# Patient Record
Sex: Female | Born: 1970 | Race: Asian | Hispanic: No | Marital: Single | Smoking: Never smoker
Health system: Southern US, Community
[De-identification: ages and names within clinical notes are randomized; demographics above are authoritative.]

## PROBLEM LIST (undated history)

## (undated) DIAGNOSIS — Z803 Family history of malignant neoplasm of breast: Secondary | ICD-10-CM

## (undated) DIAGNOSIS — Z8041 Family history of malignant neoplasm of ovary: Secondary | ICD-10-CM

## (undated) HISTORY — DX: Family history of malignant neoplasm of breast: Z80.3

## (undated) HISTORY — DX: Family history of malignant neoplasm of ovary: Z80.41

---

## 2012-12-04 HISTORY — PX: BREAST BIOPSY: SHX20

## 2012-12-12 ENCOUNTER — Other Ambulatory Visit: Payer: Self-pay | Admitting: Gynecology

## 2012-12-12 DIAGNOSIS — R928 Other abnormal and inconclusive findings on diagnostic imaging of breast: Secondary | ICD-10-CM

## 2012-12-27 ENCOUNTER — Ambulatory Visit
Admission: RE | Admit: 2012-12-27 | Discharge: 2012-12-27 | Disposition: A | Discharge status: Home / Self Care | Payer: No Typology Code available for payment source | Source: Ambulatory Visit | Attending: Gynecology | Admitting: Gynecology

## 2012-12-27 DIAGNOSIS — R928 Other abnormal and inconclusive findings on diagnostic imaging of breast: Secondary | ICD-10-CM

## 2013-05-30 ENCOUNTER — Other Ambulatory Visit: Payer: Self-pay | Admitting: Gynecology

## 2013-05-30 DIAGNOSIS — R921 Mammographic calcification found on diagnostic imaging of breast: Secondary | ICD-10-CM

## 2013-07-01 ENCOUNTER — Other Ambulatory Visit: Payer: Self-pay | Admitting: Gynecology

## 2013-07-01 ENCOUNTER — Ambulatory Visit
Admission: RE | Admit: 2013-07-01 | Discharge: 2013-07-01 | Disposition: A | Discharge status: Home / Self Care | Payer: No Typology Code available for payment source | Source: Ambulatory Visit | Attending: Gynecology | Admitting: Gynecology

## 2013-07-01 DIAGNOSIS — R921 Mammographic calcification found on diagnostic imaging of breast: Secondary | ICD-10-CM

## 2013-07-23 ENCOUNTER — Ambulatory Visit
Admission: RE | Admit: 2013-07-23 | Discharge: 2013-07-23 | Disposition: A | Discharge status: Home / Self Care | Payer: No Typology Code available for payment source | Source: Ambulatory Visit | Attending: Gynecology | Admitting: Gynecology

## 2013-07-23 DIAGNOSIS — R921 Mammographic calcification found on diagnostic imaging of breast: Secondary | ICD-10-CM

## 2014-08-25 ENCOUNTER — Other Ambulatory Visit: Payer: Self-pay

## 2014-11-05 ENCOUNTER — Other Ambulatory Visit: Payer: Self-pay | Admitting: Gynecology

## 2014-11-06 LAB — CYTOLOGY - PAP

## 2016-01-12 ENCOUNTER — Telehealth: Payer: Self-pay | Admitting: Genetic Counselor

## 2016-01-12 NOTE — Telephone Encounter (Signed)
Contacted pt and reviewed avaiable dates for the end of Feb and start of March for her genetic counseling referral

## 2016-01-31 ENCOUNTER — Ambulatory Visit (HOSPITAL_BASED_OUTPATIENT_CLINIC_OR_DEPARTMENT_OTHER): Payer: Managed Care, Other (non HMO) | Admitting: Genetic Counselor

## 2016-01-31 ENCOUNTER — Encounter: Payer: Self-pay | Admitting: Genetic Counselor

## 2016-01-31 ENCOUNTER — Other Ambulatory Visit: Payer: Managed Care, Other (non HMO)

## 2016-01-31 DIAGNOSIS — Z8041 Family history of malignant neoplasm of ovary: Secondary | ICD-10-CM

## 2016-01-31 DIAGNOSIS — Z315 Encounter for genetic counseling: Secondary | ICD-10-CM | POA: Diagnosis not present

## 2016-01-31 DIAGNOSIS — Z803 Family history of malignant neoplasm of breast: Secondary | ICD-10-CM | POA: Insufficient documentation

## 2016-01-31 NOTE — Progress Notes (Signed)
REFERRING PROVIDER: David Lowe, MD [address], [address] [place], [place] [address]  PRIMARY PROVIDER:  No primary care provider on file.  PRIMARY REASON FOR VISIT:  1. Family history of ovarian cancer   2. Family history of breast cancer      HISTORY OF PRESENT ILLNESS:   Deborah Lynch, a 45 y.o. female, was seen for a [place] cancer genetics consultation at the request of Dr. Lowe due to a family history of cancer.  Deborah Lynch presents to clinic today to discuss the possibility of a hereditary predisposition to cancer, genetic testing, and to further clarify her future cancer risks, as well as potential cancer risks for family members. Deborah Lynch is a 45 y.o. female with no personal history of cancer.  She is concerned about her risk and her children's risk for developing cancer.  CANCER HISTORY:   No history exists.     HORMONAL RISK FACTORS:  Menarche was at age 12.  First live birth at age 33.  OCP use for approximately 0 years.  Ovaries intact: yes.  Hysterectomy: no.  Menopausal status: perimenopausal.  HRT use: 0 years. Colonoscopy: no; not examined. Mammogram within the last year: yes. Number of breast biopsies: 1. Up to date with pelvic exams:  yes. Any excessive radiation exposure in the past:  no  Past Medical History  Diagnosis Date  . Family history of breast cancer   . Family history of ovarian cancer     History reviewed. No pertinent past surgical history.  Social History   Social History  . Marital Status: Single    Spouse Name: N/A  . Number of Children: 2  . Years of Education: N/A   Social History Main Topics  . Smoking status: Never Smoker   . Smokeless tobacco: None  . Alcohol Use: No  . Drug Use: No  . Sexual Activity: Not Asked   Other Topics Concern  . None   Social History Narrative  . None     FAMILY HISTORY:  We obtained a detailed, 4-generation family history.  Significant diagnoses are listed below: Family History   Problem Relation Age of Onset  . Breast cancer Mother 52  . Ovarian cancer Mother 75  . CAD Father   . Heart attack Paternal Uncle 38  . Breast cancer Maternal Grandmother 32    2nd breast cancer at 42  . Heart attack Paternal Grandfather   . Breast cancer Maternal Aunt 53    The patient has two children who are cancer free.  Her brother is alive and well at age 44.  The patient's mother was diagnosed with breast cancer at age 52 and ovarian cancer at age 75.  Her mother had two sisters, one who had breast cancer at 53.  The patient's maternal grandmother had breast cancer at 42 and again at 52.  Her grandfather died at age 80.  The pateint's father is alive at 80.  HE has CAD.  He had one brother who died of a heart attack at age 38.  His mother died at 92 and his father died at 49 from a heart attack.  Patient's maternal ancestors are of [place] descent, and paternal ancestors are of [place] descent. There is no reported Ashkenazi Jewish ancestry. There is no known consanguinity.  GENETIC COUNSELING ASSESSMENT: Deborah Lynch is a 45 y.o. female with a family history of breast and ovarian cancer which is somewhat suggestive of a hereditary cancer syndrome and predisposition   to cancer. We, therefore, discussed and recommended the following at today's visit.   DISCUSSION: We discussed taht about 5-10% of breast cancer is hereditary with BRCA mutations being the most common cause of hereditary breast cancer.  We can also see mutations in other genes including PALB2, CHEK2 and ATM.  We reviewed the characteristics, features and inheritance patterns of hereditary cancer syndromes. We also discussed genetic testing, including the appropriate family members to test, the process of testing, insurance coverage and turn-around-time for results. We discussed the implications of a negative, positive and/or variant of uncertain significant result. We recommended Deborah Lynch pursue genetic testing  for the Breast/Ovarian cancer gene panel. The Breast/Ovarian gene panel offered by [facility] includes sequencing and rearrangement analysis for the following 20 genes:  ATM, BARD1, BRCA1, BRCA2, BRIP1, CDH1, CHEK2, EPCAM, FANCC, MLH1, MSH2, MSH6, NBN, PALB2, PMS2, PTEN, RAD51C, RAD51D, TP53, and XRCC2.     Based on Deborah Lynch's family history of cancer, she meets medical criteria for genetic testing. Despite that she meets criteria, she may still have an out of pocket cost. We discussed that if her out of pocket cost for testing is over $100, the laboratory will call and confirm whether she wants to proceed with testing.  If the out of pocket cost of testing is less than $100 she will be billed by the genetic testing laboratory.   In order to estimate her chance of having a BRCA mutation, we used statistical models (Penn II, Myriad calculator and [employer]) and laboratory data that take into account her personal medical history, family history and ancestry.  Because each model is different, there can be a lot of variability in the risks they give.  Therefore, these numbers must be considered a rough range and not a precise risk of having a BRCA mutation.  These models estimate that she has approximately a 7.2-17.72% chance of having a mutation. Based on this assessment of her family and personal history, genetic testing is recommended.  Based on the patient's personal and family history, statistical models (Tyrer Cusik)  and literature data were used to estimate her risk of developing breast cancer. These estimate her lifetime risk of developing breast cancer to be approximately 47.7%. This estimation does not take into account any genetic testing results.  The patient's lifetime breast cancer risk is a preliminary estimate based on available information using one of several models endorsed by the [place] (ACS). The ACS recommends consideration of breast MRI screening as an adjunct to  mammography for patients at high risk (defined as 20% or greater lifetime risk). A more detailed breast cancer risk assessment can be considered, if clinically indicated.   Deborah Lynch has been determined to be at high risk for breast cancer.  Therefore, we recommend that annual screening with mammography and breast MRI begin at age 60, or 10 years prior to the age of breast cancer diagnosis in a relative (whichever is earlier).  We discussed that Deborah Lynch should discuss her individual situation with her referring physician and determine a breast cancer screening plan with which they are both comfortable.    PLAN: After considering the risks, benefits, and limitations, Deborah Lynch  provided informed consent to pursue genetic testing and the blood sample was sent to [facility] for analysis of the Breast/Ovarian cancer panel. Results should be available within approximately 2-3 weeks' time, at which point they will be disclosed by telephone to Deborah Lynch, as will any additional recommendations warranted by these  results. Deborah Lynch will receive a summary of her genetic counseling visit and a copy of her results once available. This information will also be available in Epic. We encouraged Deborah Lynch to remain in contact with cancer genetics annually so that we can continuously update the family history and inform her of any changes in cancer genetics and testing that may be of benefit for her family. Deborah Lynch questions were answered to her satisfaction today. Our contact information was provided should additional questions or concerns arise.  Based on Deborah Lynch's family history, we recommended her mother, who was diagnosed with ovarian cancer at age 39 and breast cancer at age 71, have genetic counseling and testing. Deborah Lynch will let us know if we can be of any assistance in coordinating genetic counseling and/or testing for this family member.   Lastly, we encouraged Deborah Lynch to remain in  contact with cancer genetics annually so that we can continuously update the family history and inform her of any changes in cancer genetics and testing that may be of benefit for this family.   Ms.  Lynch questions were answered to her satisfaction today. Our contact information was provided should additional questions or concerns arise. Thank you for the referral and allowing [place] to share in the care of your patient.   Karlyn Glasco P. Florene Glen, [place], [facility] Certified Genetic Counselor [email or web address] phone: [phone]  The patient was seen for a total of 60 minutes in face-to-face genetic counseling.  This patient was discussed with Drs. Magrinat, Lindi Adie and/or Burr Medico who agrees with the above.    _______________________________________________________________________ For Office Staff:  Number of people involved in session: 1 Was an Intern/ student involved with case: yes

## 2016-02-02 ENCOUNTER — Other Ambulatory Visit: Payer: Self-pay | Admitting: Obstetrics and Gynecology

## 2016-02-02 DIAGNOSIS — R922 Inconclusive mammogram: Secondary | ICD-10-CM

## 2016-02-17 ENCOUNTER — Telehealth: Payer: Self-pay | Admitting: Genetic Counselor

## 2016-02-17 ENCOUNTER — Encounter: Payer: Self-pay | Admitting: Genetic Counselor

## 2016-02-17 DIAGNOSIS — Z1379 Encounter for other screening for genetic and chromosomal anomalies: Secondary | ICD-10-CM | POA: Insufficient documentation

## 2016-02-17 NOTE — Telephone Encounter (Signed)
Revealed negative genetic testing on the B/O panel.  Discussed that the most informative person to test is her mother.  She would like to get her mother tested,  And she can come to [facility] rather than [facility] or [facility] [employer].  Her mother is not in our system so she will need to be entered in before we schedule.  Explained that there are testing options for her mother since her mother is not a [place] citizen. We can use Myriad financial assistance or we could do [facility] with an OOP of about $250.  I will email the Myriad application to her so she can view it and she will get her mother scheduled.

## 2016-02-18 ENCOUNTER — Ambulatory Visit: Payer: Self-pay | Admitting: Genetic Counselor

## 2016-02-18 DIAGNOSIS — Z1379 Encounter for other screening for genetic and chromosomal anomalies: Secondary | ICD-10-CM

## 2016-02-18 DIAGNOSIS — Z8041 Family history of malignant neoplasm of ovary: Secondary | ICD-10-CM

## 2016-02-18 DIAGNOSIS — Z803 Family history of malignant neoplasm of breast: Secondary | ICD-10-CM

## 2016-02-18 NOTE — Progress Notes (Signed)
HPI: Ms. Pointer was previously seen in the [place] clinic due to a family history of cancer and concerns regarding a hereditary predisposition to cancer. Please refer to our prior cancer genetics clinic note for more information regarding Ms. Kalis's medical, social and family histories, and our assessment and recommendations, at the time. Ms. Kenedy recent genetic test results were disclosed to her, as were recommendations warranted by these results. These results and recommendations are discussed in more detail below.  FAMILY HISTORY:  We obtained a detailed, 4-generation family history.  Significant diagnoses are listed below: Family History  Problem Relation Age of Onset  . Breast cancer Mother 82  . Ovarian cancer Mother 50  . CAD Father   . Heart attack Paternal Uncle 48  . Breast cancer Maternal Grandmother 32    2nd breast cancer at 71  . Heart attack Paternal Grandfather   . Breast cancer Maternal Aunt 53    The patient has two children who are cancer free. Her brother is alive and well at age 19. The patient's mother was diagnosed with breast cancer at age 62 and ovarian cancer at age 17. Her mother had two sisters, one who had breast cancer at 53. The patient's maternal grandmother had breast cancer at 73 and again at 85. Her grandfather died at age 7. The pateint's father is alive at 72. HE has CAD. He had one brother who died of a heart attack at age 103. His mother died at 63 and his father died at 25 from a heart attack. Patient's maternal ancestors are of [place] descent, and paternal ancestors are of [place] descent. There is no reported Ashkenazi Jewish ancestry. There is no known consanguinity.  GENETIC TEST RESULTS: At the time of Ms. Borges's visit, we recommended she pursue genetic testing of the Breast/Ovarian cancer gene panel. The Breast/Ovarian gene panel offered by [facility] includes sequencing and rearrangement analysis for  the following 20 genes:  ATM, BARD1, BRCA1, BRCA2, BRIP1, CDH1, CHEK2, EPCAM, FANCC, MLH1, MSH2, MSH6, NBN, PALB2, PMS2, PTEN, RAD51C, RAD51D, TP53, and XRCC2.   The report date is February 16, 2016.  Genetic testing was normal, and did not reveal a deleterious mutation in these genes. The test report has been scanned into EPIC and is located under the Molecular Pathology section of the Results Review tab.   We discussed with Ms. Dresser that since the current genetic testing is not perfect, it is possible there may be a gene mutation in one of these genes that current testing cannot detect, but that chance is small. We also discussed, that it is possible that another gene that has not yet been discovered, or that we have not yet tested, is responsible for the cancer diagnoses in the family, and it is, therefore, important to remain in touch with cancer genetics in the future so that we can continue to offer Ms. Rondon the most up to date genetic testing.   CANCER SCREENING RECOMMENDATIONS: Given Ms. Rish's personal and family histories, we must interpret these negative results with some caution.  Families with features suggestive of hereditary risk for cancer tend to have multiple family members with cancer, diagnoses in multiple generations and diagnoses before the age of 110. Ms. Biber family exhibits some of these features. Thus this result may simply reflect our current inability to detect all mutations within these genes or there may be a different gene that has not yet been discovered or tested.   RECOMMENDATIONS FOR  FAMILY MEMBERS: Women in this family might be at some increased risk of developing cancer, over the general population risk, simply due to the family history of cancer. We recommended women in this family have a yearly mammogram beginning at age 23, or 108 years younger than the earliest onset of cancer, an an annual clinical breast exam, and perform monthly breast self-exams. Women in  this family should also have a gynecological exam as recommended by their primary provider. All family members should have a colonoscopy by age 59.  Based on Ms. Saggese's family history, we recommended her mother, who was diagnosed with breast cancer at age 78 and ovarian cancer at age 21, have genetic counseling and testing. Ms. Mcgonagle will let us know if we can be of any assistance in coordinating genetic counseling and/or testing for this family member. She will call our appointment line and schedule her mother in the genetics clinic for genetic testing.  FOLLOW-UP: Lastly, we discussed with Ms. Southers that cancer genetics is a rapidly advancing field and it is possible that new genetic tests will be appropriate for her and/or her family members in the future. We encouraged her to remain in contact with cancer genetics on an annual basis so we can update her personal and family histories and let her know of advances in cancer genetics that may benefit this family.   Our contact number was provided. Ms. Bacha questions were answered to her satisfaction, and she knows she is welcome to call us at anytime with additional questions or concerns.   Roma Kayser, MS, [facility] Certified Genetic Counselor [email or web address]

## 2017-12-04 HISTORY — PX: BREAST BIOPSY: SHX20

## 2018-07-11 ENCOUNTER — Other Ambulatory Visit: Payer: Self-pay | Admitting: Obstetrics and Gynecology

## 2018-07-11 DIAGNOSIS — Z803 Family history of malignant neoplasm of breast: Secondary | ICD-10-CM

## 2018-07-22 ENCOUNTER — Ambulatory Visit
Admission: RE | Admit: 2018-07-22 | Discharge: 2018-07-22 | Disposition: A | Discharge status: Home / Self Care | Payer: Managed Care, Other (non HMO) | Source: Ambulatory Visit | Attending: Obstetrics and Gynecology | Admitting: Obstetrics and Gynecology

## 2018-07-22 DIAGNOSIS — Z803 Family history of malignant neoplasm of breast: Secondary | ICD-10-CM

## 2018-07-22 MED ORDER — GADOBENATE DIMEGLUMINE 529 MG/ML IV SOLN
11.0000 mL | Freq: Once | INTRAVENOUS | Status: AC | PRN
  Administered 2018-07-22: 11 mL via INTRAVENOUS

## 2018-07-24 ENCOUNTER — Other Ambulatory Visit: Payer: Self-pay | Admitting: Obstetrics and Gynecology

## 2018-07-24 DIAGNOSIS — R9389 Abnormal findings on diagnostic imaging of other specified body structures: Secondary | ICD-10-CM

## 2018-07-26 ENCOUNTER — Other Ambulatory Visit: Payer: Self-pay | Admitting: Obstetrics and Gynecology

## 2018-07-26 DIAGNOSIS — R9389 Abnormal findings on diagnostic imaging of other specified body structures: Secondary | ICD-10-CM

## 2018-08-02 ENCOUNTER — Ambulatory Visit
Admission: RE | Admit: 2018-08-02 | Discharge: 2018-08-02 | Disposition: A | Discharge status: Home / Self Care | Payer: Managed Care, Other (non HMO) | Source: Ambulatory Visit | Attending: Obstetrics and Gynecology | Admitting: Obstetrics and Gynecology

## 2018-08-02 ENCOUNTER — Other Ambulatory Visit: Payer: Self-pay | Admitting: Obstetrics and Gynecology

## 2018-08-02 DIAGNOSIS — R9389 Abnormal findings on diagnostic imaging of other specified body structures: Secondary | ICD-10-CM

## 2018-08-02 MED ORDER — GADOBENATE DIMEGLUMINE 529 MG/ML IV SOLN
11.0000 mL | Freq: Once | INTRAVENOUS | Status: AC | PRN
  Administered 2018-08-02: 11 mL via INTRAVENOUS

## 2018-08-09 ENCOUNTER — Other Ambulatory Visit: Payer: Managed Care, Other (non HMO)

## 2018-08-12 ENCOUNTER — Ambulatory Visit
Admission: RE | Admit: 2018-08-12 | Discharge: 2018-08-12 | Disposition: A | Discharge status: Home / Self Care | Payer: Managed Care, Other (non HMO) | Source: Ambulatory Visit | Attending: Obstetrics and Gynecology | Admitting: Obstetrics and Gynecology

## 2018-08-12 DIAGNOSIS — R9389 Abnormal findings on diagnostic imaging of other specified body structures: Secondary | ICD-10-CM

## 2018-08-12 MED ORDER — GADOBENATE DIMEGLUMINE 529 MG/ML IV SOLN: 10.0000 mL | Freq: Once | INTRAVENOUS | Status: DC | PRN

## 2019-01-06 IMAGING — MR MR BREAST BX W LOC DEV 1ST LESION IMAGE BX SPEC MR GUIDE*R*
9 of 12 series · 34 of 48 positions shown · IV contrast (11ml Multihance)
Comparison: Previous exams.

ADDENDUM:
Pathology revealed FIBROCYSTIC CHANGES WITH ADENOSIS of RIGHT
breast, central. This was found to be concordant by Dr. Seby
Quram.

Pathology revealed FIBROADENOMA of LEFT breast, 1 o'clock. This was
found to be concordant by Dr. Adalbjorn Patagoc.
Pathology results were discussed with the patient by telephone. The
patient reported doing well after the biopsy with tenderness at the
site. Post biopsy instructions and care were reviewed and questions
were answered. The patient was encouraged to call The [REDACTED]
It is recommended the patient have two additional LEFT breast MRI
guided core needle biopsies which have been scheduled for [place]
Further recommendations will be dictated post LEFT breast MRI
biopsies.
Pathology results reported by Dombi Bilics, RN on 08/06/2018.
CLINICAL DATA: Right central breast area of linear non mass
enhancement seen on most recent screening MRI.
EXAM:
MRI GUIDED CORE NEEDLE BIOPSY OF THE RIGHT BREAST
TECHNIQUE: Multiplanar, multisequence MR imaging of the right breast was
performed both before and after administration of intravenous
contrast.
CONTRAST:  11mL MULTIHANCE GADOBENATE DIMEGLUMINE 529 MG/ML IV SOLN

[Series 3: fiducial unilateral · sagittal · 2.0mm · 1.33mm/px · 3 of 56 slices shown]
[im 1/56]
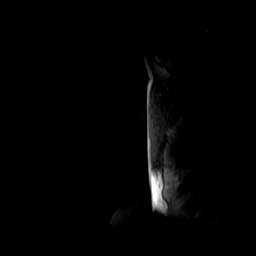
[im 28/56]
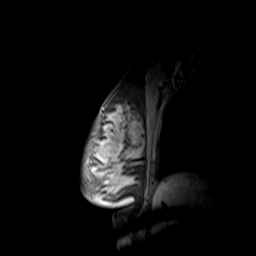
[im 56/56]
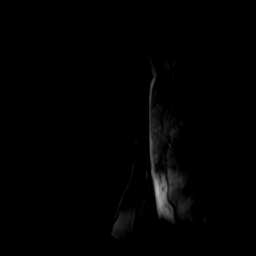

[Series 4: dynamic pre · axial · non-contrast · 1.3mm · 0.73mm/px · z∈[-76,+110]mm · 5 of 144 slices shown]
[im 1/144]
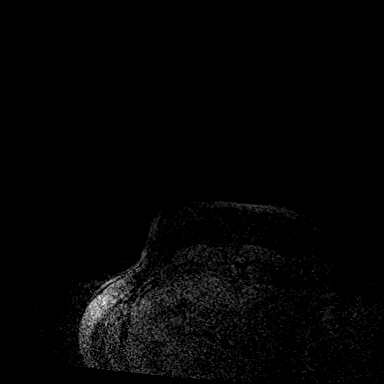
[im 36/144]
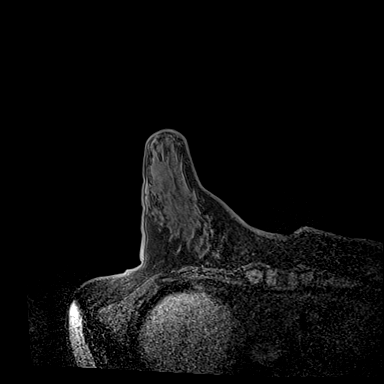
[im 72/144]
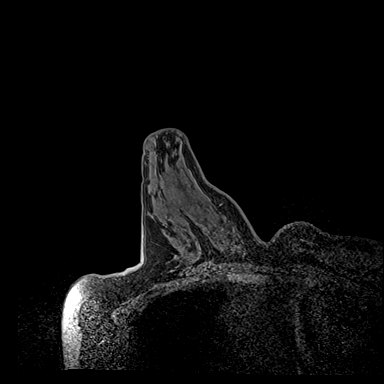
[im 108/144]
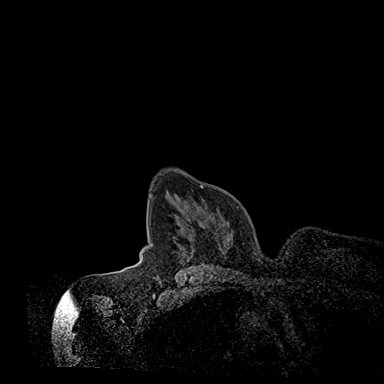
[im 144/144]
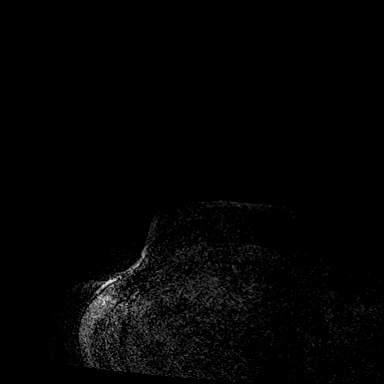

[Series 5: dynamic post 20 · axial · 1.3mm · 0.73mm/px · z∈[-76,+110]mm · 4 of 144 slices shown (1 of 2)]
[im 1/144]
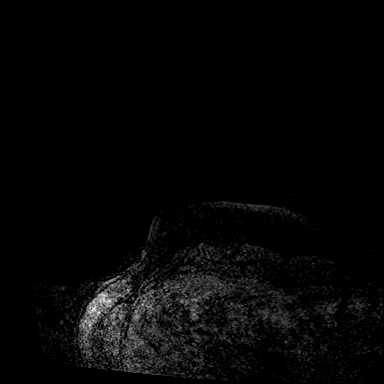
[im 48/144]
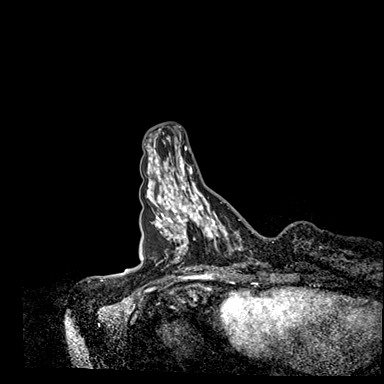
[im 96/144]
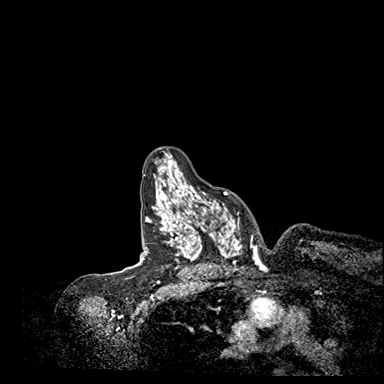
[im 144/144]
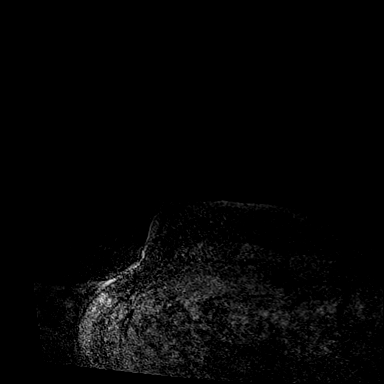

[Series 6: dynamic post 20 · axial · 1.3mm · 0.73mm/px · z∈[-76,+110]mm · 4 of 144 slices shown (2 of 2)]
[im 1/144]
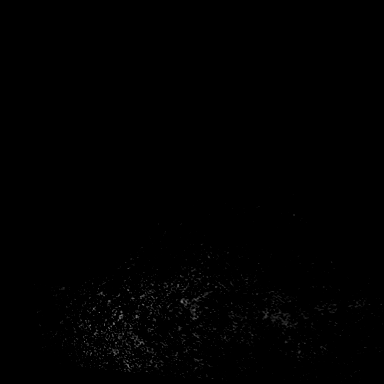
[im 48/144]
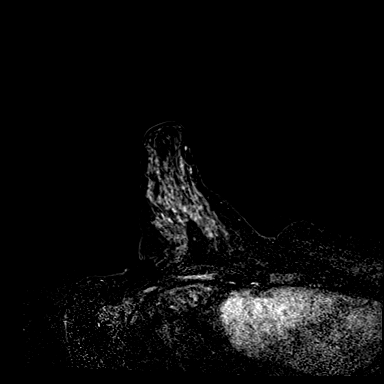
[im 96/144]
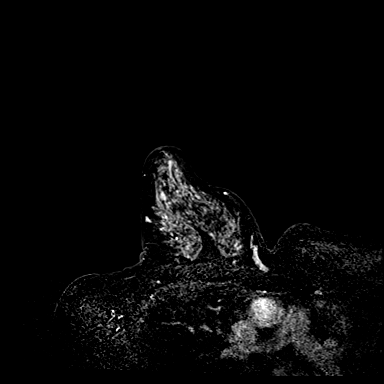
[im 144/144]
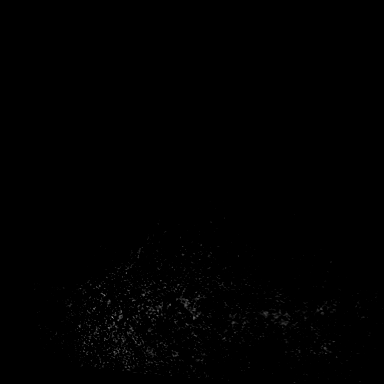

[Series 7: dynamic post 3 · axial · 1.3mm · 0.73mm/px · z∈[-76,+110]mm · 4 of 144 slices shown (1 of 2)]
[im 1/144]
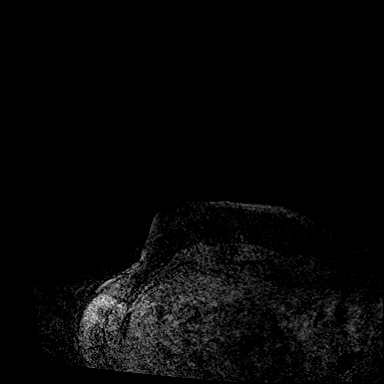
[im 48/144]
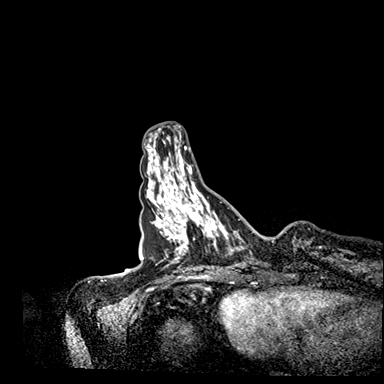
[im 96/144]
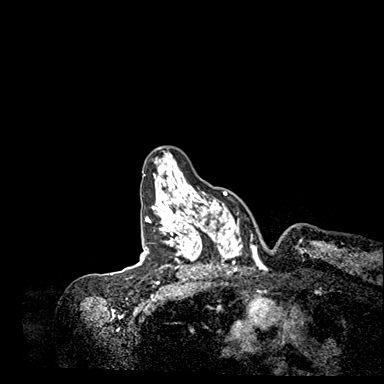
[im 144/144]
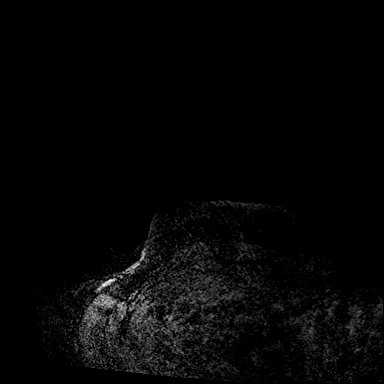

[Series 8: dynamic post 3 · axial · 1.3mm · 0.73mm/px · z∈[-76,+110]mm · 4 of 144 slices shown (2 of 2)]
[im 1/144]
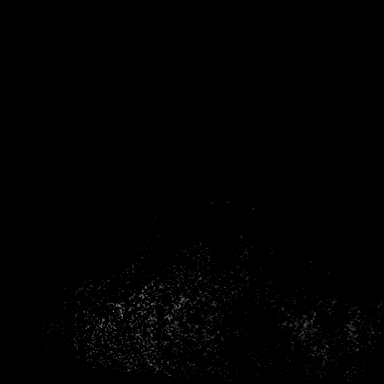
[im 48/144]
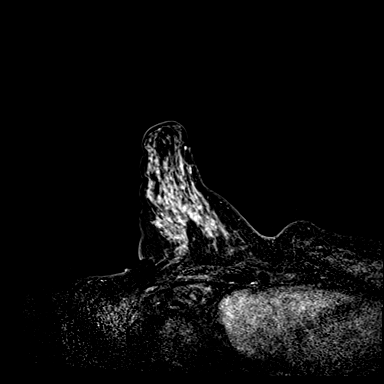
[im 96/144]
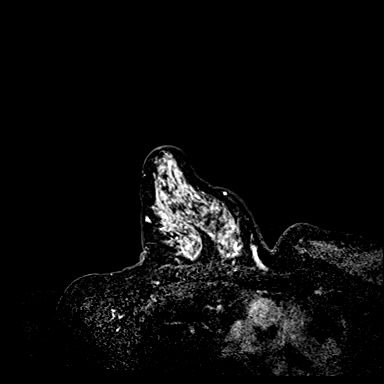
[im 144/144]
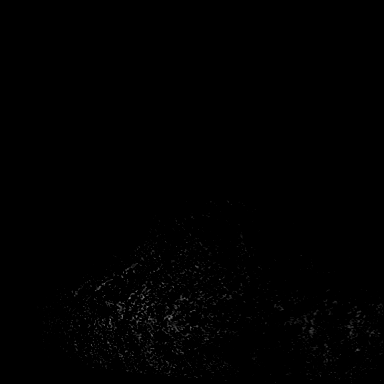

[Series 9: needle confirmation · axial · 1.3mm · 0.73mm/px · z∈[-76,+110]mm · 4 of 144 slices shown]
[im 1/144]
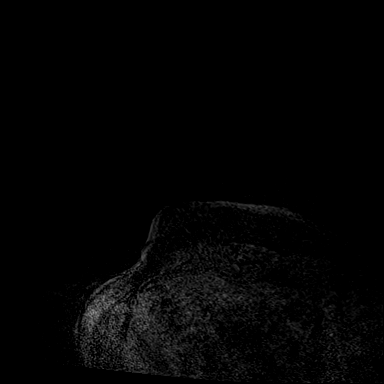
[im 48/144]
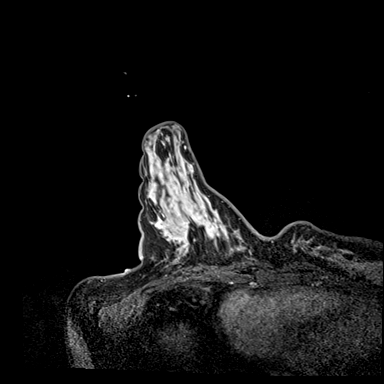
[im 96/144]
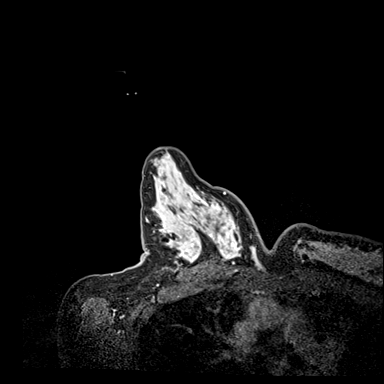
[im 144/144]
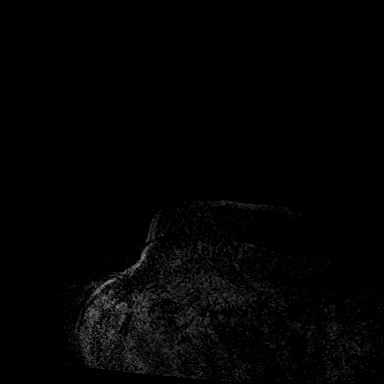

[Series 10: needle confirmation_sub · axial · 1.3mm · 0.73mm/px · z∈[-76,+110]mm · 4 of 142 slices shown]
[im 1/142]
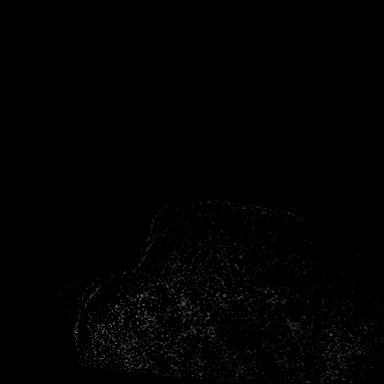
[im 48/142]
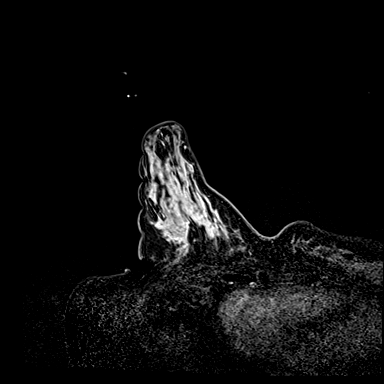
[im 95/142]
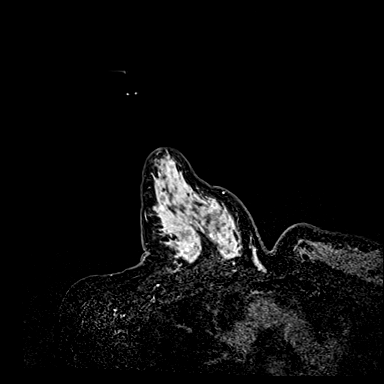
[im 142/142]
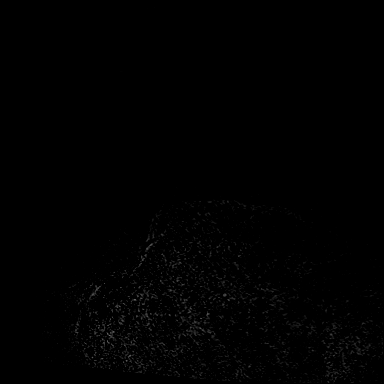

[Series 11: post bx · axial · 1.3mm · 0.73mm/px · z∈[-76,-15]mm · 2 of 144 slices shown]
[im 1/144]
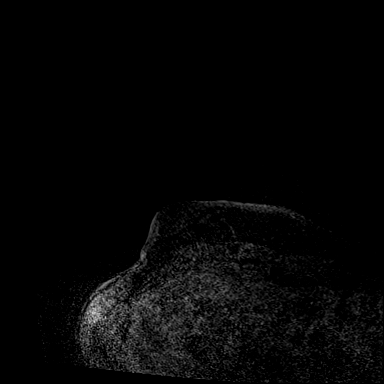
[im 48/144]
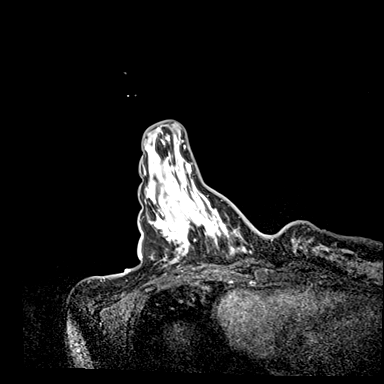

[34 of 48 positions shown; findings below may reference images not displayed]

FINDINGS: I met with the patient, and we discussed the procedure of MRI guided
biopsy, including risks, benefits, and alternatives. Specifically,
we discussed the risks of infection, bleeding, tissue injury, clip
migration, and inadequate sampling. Informed, written consent was
given. The usual time out protocol was performed immediately prior
to the procedure.

Using sterile technique, 1% Lidocaine, MRI guidance, and a 9 gauge
vacuum assisted device, biopsy was performed of right central br[REDACTED]ar non mass enhancement using a lateral approach. At the
conclusion of the procedure, a cylinder shaped tissue marker clip
was deployed into the biopsy cavity. Follow-up 2-view mammogram was
performed and dictated separately.
IMPRESSION: MRI guided biopsy of the right breast.  No apparent complications.

## 2019-02-06 ENCOUNTER — Other Ambulatory Visit: Payer: Self-pay | Admitting: Obstetrics and Gynecology

## 2019-02-06 DIAGNOSIS — R9389 Abnormal findings on diagnostic imaging of other specified body structures: Secondary | ICD-10-CM

## 2019-02-18 ENCOUNTER — Other Ambulatory Visit: Payer: Self-pay | Admitting: Obstetrics and Gynecology

## 2019-02-19 ENCOUNTER — Other Ambulatory Visit: Payer: Managed Care, Other (non HMO)

## 2019-07-03 ENCOUNTER — Ambulatory Visit
Admission: RE | Admit: 2019-07-03 | Discharge: 2019-07-03 | Disposition: A | Discharge status: Home / Self Care | Payer: 59 | Source: Ambulatory Visit | Attending: Obstetrics and Gynecology | Admitting: Obstetrics and Gynecology

## 2019-07-03 DIAGNOSIS — R9389 Abnormal findings on diagnostic imaging of other specified body structures: Secondary | ICD-10-CM

## 2019-07-03 MED ORDER — GADOBUTROL 1 MMOL/ML IV SOLN
6.0000 mL | Freq: Once | INTRAVENOUS | Status: AC | PRN
  Administered 2019-07-03: 6 mL via INTRAVENOUS

## 2020-09-06 ENCOUNTER — Other Ambulatory Visit: Payer: Self-pay | Admitting: Obstetrics and Gynecology

## 2020-09-06 DIAGNOSIS — R928 Other abnormal and inconclusive findings on diagnostic imaging of breast: Secondary | ICD-10-CM

## 2020-10-04 ENCOUNTER — Other Ambulatory Visit: Payer: Self-pay | Admitting: Obstetrics and Gynecology

## 2020-10-04 ENCOUNTER — Ambulatory Visit
Admission: RE | Admit: 2020-10-04 | Discharge: 2020-10-04 | Disposition: A | Discharge status: Home / Self Care | Payer: 59 | Source: Ambulatory Visit | Attending: Obstetrics and Gynecology | Admitting: Obstetrics and Gynecology

## 2020-10-04 ENCOUNTER — Other Ambulatory Visit: Payer: Self-pay

## 2020-10-04 DIAGNOSIS — R928 Other abnormal and inconclusive findings on diagnostic imaging of breast: Secondary | ICD-10-CM

## 2020-10-04 DIAGNOSIS — R921 Mammographic calcification found on diagnostic imaging of breast: Secondary | ICD-10-CM

## 2020-10-21 ENCOUNTER — Ambulatory Visit
Admission: RE | Admit: 2020-10-21 | Discharge: 2020-10-21 | Disposition: A | Discharge status: Home / Self Care | Payer: 59 | Source: Ambulatory Visit | Attending: Obstetrics and Gynecology | Admitting: Obstetrics and Gynecology

## 2020-10-21 ENCOUNTER — Other Ambulatory Visit: Payer: Self-pay | Admitting: Obstetrics and Gynecology

## 2020-10-21 ENCOUNTER — Other Ambulatory Visit: Payer: Self-pay

## 2020-10-21 DIAGNOSIS — R921 Mammographic calcification found on diagnostic imaging of breast: Secondary | ICD-10-CM

## 2021-10-17 ENCOUNTER — Other Ambulatory Visit: Payer: Self-pay | Admitting: Obstetrics and Gynecology

## 2021-10-17 DIAGNOSIS — R928 Other abnormal and inconclusive findings on diagnostic imaging of breast: Secondary | ICD-10-CM

## 2021-11-17 ENCOUNTER — Other Ambulatory Visit: Payer: Self-pay | Admitting: Obstetrics and Gynecology

## 2021-11-17 ENCOUNTER — Ambulatory Visit
Admission: RE | Admit: 2021-11-17 | Discharge: 2021-11-17 | Disposition: A | Discharge status: Home / Self Care | Payer: 59 | Source: Ambulatory Visit | Attending: Obstetrics and Gynecology | Admitting: Obstetrics and Gynecology

## 2021-11-17 DIAGNOSIS — R928 Other abnormal and inconclusive findings on diagnostic imaging of breast: Secondary | ICD-10-CM

## 2021-11-17 DIAGNOSIS — N6310 Unspecified lump in the right breast, unspecified quadrant: Secondary | ICD-10-CM

## 2022-05-22 ENCOUNTER — Other Ambulatory Visit: Payer: 59

## 2022-06-01 ENCOUNTER — Ambulatory Visit: Payer: 59

## 2022-07-06 ENCOUNTER — Ambulatory Visit
Admission: RE | Admit: 2022-07-06 | Discharge: 2022-07-06 | Disposition: A | Discharge status: Home / Self Care | Payer: 59 | Source: Ambulatory Visit | Attending: Obstetrics and Gynecology | Admitting: Obstetrics and Gynecology

## 2022-07-06 ENCOUNTER — Other Ambulatory Visit: Payer: Self-pay | Admitting: Obstetrics and Gynecology

## 2022-07-06 DIAGNOSIS — R928 Other abnormal and inconclusive findings on diagnostic imaging of breast: Secondary | ICD-10-CM

## 2022-07-06 DIAGNOSIS — N6310 Unspecified lump in the right breast, unspecified quadrant: Secondary | ICD-10-CM

## 2022-07-20 ENCOUNTER — Ambulatory Visit
Admission: RE | Admit: 2022-07-20 | Discharge: 2022-07-20 | Disposition: A | Discharge status: Home / Self Care | Payer: 59 | Source: Ambulatory Visit | Attending: Obstetrics and Gynecology | Admitting: Obstetrics and Gynecology

## 2022-07-20 DIAGNOSIS — N6310 Unspecified lump in the right breast, unspecified quadrant: Secondary | ICD-10-CM

## 2022-08-11 ENCOUNTER — Ambulatory Visit: Payer: Self-pay | Admitting: General Surgery

## 2022-08-11 DIAGNOSIS — N6489 Other specified disorders of breast: Secondary | ICD-10-CM

## 2022-08-15 ENCOUNTER — Other Ambulatory Visit: Payer: Self-pay | Admitting: General Surgery

## 2022-08-15 DIAGNOSIS — N6489 Other specified disorders of breast: Secondary | ICD-10-CM

## 2022-09-13 ENCOUNTER — Encounter (HOSPITAL_BASED_OUTPATIENT_CLINIC_OR_DEPARTMENT_OTHER): Payer: Self-pay | Admitting: General Surgery

## 2022-09-13 NOTE — Progress Notes (Signed)
   09/13/22 [place]  Pre-op Phone Call  Surgery Date Verified 09/22/22  Arrival Time Verified 0945  Surgery Location Verified [facility] [place]  Medical History Reviewed Yes  Does the patient have diabetes? No diagnosis of diabetes  Do you have a history of heart problems? No  Antiarrhythmic device type  (NA)  Patient educated on enhanced recovery. Yes  Patient educated about smoking cessation 24 hours prior to surgery. N/A Non-Smoker  Patient verbalizes understanding of bowel prep? N/A  Med Rec Completed Yes  Take the Following Meds the Morning of Surgery none (hold vit/herb/nsaids x5d)  Recent  Lab Work, EKG, CXR? No  NPO (Including gum & candy) After midnight  Patient instructed to stop clear liquids including Carb loading drink at: 0745  Stop Solids, Milk, Candy, and Gum STARTING AT MIDNIGHT  Responsible adult to drive and be with you for 24 hours? Yes  Name & Phone Number for Ride/Caregiver brother, [place] [phone]  No Jewelry, money, nail polish or make-up.  No lotions, powders, perfumes. No shaving  48 hrs. prior to surgery. Yes  Contacts, Dentures & Glasses Will Have to be Removed Before OR. Yes  Please bring your ID and Insurance Card the morning of your surgery. (Surgery Centers Only) Yes  Bring any papers or x-rays with you that your surgeon gave you. Yes  Instructed to contact the location of procedure/ provider if they or anyone in their household develops symptoms or tests positive for COVID-19, has close contact with someone who tests positive for COVID, or has known exposure to any contagious illness. Yes  Call this number the morning of surgery  with any problems that may cancel your surgery. [phone]  Covid-19 Assessment  Have you had a positive COVID-19 test within the previous 90 days? (S)  Yes (date: 07-27-22. No problems, no residual from infection)  COVID Testing Guidance Patients with a positive COVID test should not be retested within 90 days. If patient tested positive  within the previous 10 days (21 days if immunocompromised or required hospitalization for COVID) treat the patient as COVID positive (airborne/contact isolation) and notify anesthesiologist/surgeon to determine if surgery needs to be delayed.  Patient's surgery required a COVID-19 test (cardiothoracic, complex ENT, and bronchoscopies/EBUS) No

## 2022-09-21 ENCOUNTER — Ambulatory Visit
Admission: RE | Admit: 2022-09-21 | Discharge: 2022-09-21 | Disposition: A | Discharge status: Home / Self Care | Payer: 59 | Source: Ambulatory Visit | Attending: General Surgery | Admitting: General Surgery

## 2022-09-21 DIAGNOSIS — N6489 Other specified disorders of breast: Secondary | ICD-10-CM

## 2022-09-21 MED ORDER — CHLORHEXIDINE GLUCONATE CLOTH 2 % EX PADS: 6.0000 | MEDICATED_PAD | Freq: Once | CUTANEOUS | Status: DC

## 2022-09-21 NOTE — Progress Notes (Signed)
       Patient Instructions  The night before surgery:  No food after midnight. ONLY clear liquids after midnight  The day of surgery (if you do NOT have diabetes):  Drink ONE (1) Pre-Surgery Clear Ensure as directed.   This drink was given to you during your hospital  pre-op appointment visit. The pre-op nurse will instruct you on the time to drink the  Pre-Surgery Ensure depending on your surgery time. Finish the drink at the designated time by the pre-op nurse.  Nothing else to drink after completing the  Pre-Surgery Clear Ensure.  The day of surgery (if you have diabetes): Drink ONE (1) Gatorade 2 (G2) as directed. This drink was given to you during your hospital  pre-op appointment visit.  The pre-op nurse will instruct you on the time to drink the   Gatorade 2 (G2) depending on your surgery time. Color of the Gatorade may vary. Red is not allowed. Nothing else to drink after completing the  Gatorade 2 (G2).         If you have questions, please contact your surgeon's office. Surgical soap given to patient with instructions and patient verbalized understanding.  

## 2022-09-22 ENCOUNTER — Ambulatory Visit (HOSPITAL_BASED_OUTPATIENT_CLINIC_OR_DEPARTMENT_OTHER): Payer: 59 | Admitting: Anesthesiology

## 2022-09-22 ENCOUNTER — Other Ambulatory Visit: Payer: Self-pay

## 2022-09-22 ENCOUNTER — Encounter (HOSPITAL_BASED_OUTPATIENT_CLINIC_OR_DEPARTMENT_OTHER)
Admission: RE | Disposition: A | Discharge status: Home / Self Care | Payer: Self-pay | Source: Ambulatory Visit | Attending: General Surgery

## 2022-09-22 ENCOUNTER — Ambulatory Visit (HOSPITAL_BASED_OUTPATIENT_CLINIC_OR_DEPARTMENT_OTHER)
Admission: RE | Admit: 2022-09-22 | Discharge: 2022-09-22 | Disposition: A | Discharge status: Home / Self Care | Payer: 59 | Source: Ambulatory Visit | Attending: General Surgery | Admitting: General Surgery

## 2022-09-22 ENCOUNTER — Ambulatory Visit
Admission: RE | Admit: 2022-09-22 | Discharge: 2022-09-22 | Disposition: A | Discharge status: Home / Self Care | Payer: 59 | Source: Ambulatory Visit | Attending: General Surgery | Admitting: General Surgery

## 2022-09-22 ENCOUNTER — Encounter (HOSPITAL_BASED_OUTPATIENT_CLINIC_OR_DEPARTMENT_OTHER): Payer: Self-pay | Admitting: General Surgery

## 2022-09-22 DIAGNOSIS — N6041 Mammary duct ectasia of right breast: Secondary | ICD-10-CM | POA: Insufficient documentation

## 2022-09-22 DIAGNOSIS — N6011 Diffuse cystic mastopathy of right breast: Secondary | ICD-10-CM | POA: Diagnosis not present

## 2022-09-22 DIAGNOSIS — N6489 Other specified disorders of breast: Secondary | ICD-10-CM | POA: Diagnosis not present

## 2022-09-22 DIAGNOSIS — N6021 Fibroadenosis of right breast: Secondary | ICD-10-CM | POA: Insufficient documentation

## 2022-09-22 DIAGNOSIS — Z803 Family history of malignant neoplasm of breast: Secondary | ICD-10-CM | POA: Diagnosis not present

## 2022-09-22 DIAGNOSIS — D241 Benign neoplasm of right breast: Secondary | ICD-10-CM | POA: Diagnosis present

## 2022-09-22 HISTORY — PX: BREAST LUMPECTOMY WITH RADIOACTIVE SEED LOCALIZATION: SHX6424

## 2022-09-22 LAB — POCT PREGNANCY, URINE: Preg Test, Ur: NEGATIVE

## 2022-09-22 SURGERY — BREAST LUMPECTOMY WITH RADIOACTIVE SEED LOCALIZATION
Anesthesia: General | Site: Breast | Laterality: Right

## 2022-09-22 MED ORDER — FENTANYL CITRATE (PF) 100 MCG/2ML IJ SOLN: 25.0000 ug | INTRAMUSCULAR | Status: DC | PRN

## 2022-09-22 MED ORDER — CELECOXIB 200 MG PO CAPS
ORAL_CAPSULE | ORAL | Status: AC
  Filled 2022-09-22: qty 1

## 2022-09-22 MED ORDER — GABAPENTIN 300 MG PO CAPS
ORAL_CAPSULE | ORAL | Status: AC
  Filled 2022-09-22: qty 1

## 2022-09-22 MED ORDER — LACTATED RINGERS IV SOLN: INTRAVENOUS | Status: DC

## 2022-09-22 MED ORDER — EPHEDRINE 5 MG/ML INJ
INTRAVENOUS | Status: AC
  Filled 2022-09-22: qty 5

## 2022-09-22 MED ORDER — PHENYLEPHRINE 80 MCG/ML (10ML) SYRINGE FOR IV PUSH (FOR BLOOD PRESSURE SUPPORT)
PREFILLED_SYRINGE | INTRAVENOUS | Status: AC
  Filled 2022-09-22: qty 10

## 2022-09-22 MED ORDER — CEFAZOLIN SODIUM-DEXTROSE 2-4 GM/100ML-% IV SOLN
INTRAVENOUS | Status: AC
  Filled 2022-09-22: qty 100

## 2022-09-22 MED ORDER — OXYCODONE HCL 5 MG/5ML PO SOLN: 5.0000 mg | Freq: Once | ORAL | Status: DC | PRN

## 2022-09-22 MED ORDER — ONDANSETRON HCL 4 MG/2ML IJ SOLN
INTRAMUSCULAR | Status: DC | PRN
  Administered 2022-09-22: 4 mg via INTRAVENOUS

## 2022-09-22 MED ORDER — DEXAMETHASONE SODIUM PHOSPHATE 10 MG/ML IJ SOLN
INTRAMUSCULAR | Status: AC
  Filled 2022-09-22: qty 1

## 2022-09-22 MED ORDER — ACETAMINOPHEN 500 MG PO TABS
ORAL_TABLET | ORAL | Status: AC
  Filled 2022-09-22: qty 2

## 2022-09-22 MED ORDER — ATROPINE SULFATE 0.4 MG/ML IV SOLN
INTRAVENOUS | Status: AC
  Filled 2022-09-22: qty 1

## 2022-09-22 MED ORDER — PROMETHAZINE HCL 25 MG/ML IJ SOLN: 6.2500 mg | INTRAMUSCULAR | Status: DC | PRN

## 2022-09-22 MED ORDER — CEFAZOLIN SODIUM-DEXTROSE 2-4 GM/100ML-% IV SOLN
2.0000 g | INTRAVENOUS | Status: AC
  Administered 2022-09-22: 2 g via INTRAVENOUS

## 2022-09-22 MED ORDER — BUPIVACAINE-EPINEPHRINE (PF) 0.25% -1:200000 IJ SOLN
INTRAMUSCULAR | Status: DC | PRN
  Administered 2022-09-22: 20 mL

## 2022-09-22 MED ORDER — OXYCODONE HCL 5 MG PO TABS: 5.0000 mg | ORAL_TABLET | Freq: Four times a day (QID) | ORAL | 0 refills | Status: AC | PRN

## 2022-09-22 MED ORDER — MEPERIDINE HCL 25 MG/ML IJ SOLN: 6.2500 mg | INTRAMUSCULAR | Status: DC | PRN

## 2022-09-22 MED ORDER — SUCCINYLCHOLINE CHLORIDE 200 MG/10ML IV SOSY
PREFILLED_SYRINGE | INTRAVENOUS | Status: AC
  Filled 2022-09-22: qty 10

## 2022-09-22 MED ORDER — ONDANSETRON HCL 4 MG/2ML IJ SOLN
INTRAMUSCULAR | Status: AC
  Filled 2022-09-22: qty 2

## 2022-09-22 MED ORDER — EPHEDRINE SULFATE (PRESSORS) 50 MG/ML IJ SOLN
INTRAMUSCULAR | Status: DC | PRN
  Administered 2022-09-22: 10 mg via INTRAVENOUS

## 2022-09-22 MED ORDER — LIDOCAINE 2% (20 MG/ML) 5 ML SYRINGE
INTRAMUSCULAR | Status: AC
  Filled 2022-09-22: qty 5

## 2022-09-22 MED ORDER — LIDOCAINE HCL (CARDIAC) PF 100 MG/5ML IV SOSY
PREFILLED_SYRINGE | INTRAVENOUS | Status: DC | PRN
  Administered 2022-09-22: 60 mg via INTRAVENOUS

## 2022-09-22 MED ORDER — PROPOFOL 10 MG/ML IV BOLUS
INTRAVENOUS | Status: DC | PRN
  Administered 2022-09-22: 150 mg via INTRAVENOUS

## 2022-09-22 MED ORDER — CELECOXIB 200 MG PO CAPS
200.0000 mg | ORAL_CAPSULE | ORAL | Status: AC
  Administered 2022-09-22: 200 mg via ORAL

## 2022-09-22 MED ORDER — DEXAMETHASONE SODIUM PHOSPHATE 4 MG/ML IJ SOLN
INTRAMUSCULAR | Status: DC | PRN
  Administered 2022-09-22: 5 mg via INTRAVENOUS

## 2022-09-22 MED ORDER — OXYCODONE HCL 5 MG PO TABS: 5.0000 mg | ORAL_TABLET | Freq: Once | ORAL | Status: DC | PRN

## 2022-09-22 MED ORDER — MIDAZOLAM HCL 2 MG/2ML IJ SOLN
INTRAMUSCULAR | Status: AC
  Filled 2022-09-22: qty 2

## 2022-09-22 MED ORDER — ACETAMINOPHEN 500 MG PO TABS
1000.0000 mg | ORAL_TABLET | ORAL | Status: AC
  Administered 2022-09-22: 1000 mg via ORAL

## 2022-09-22 MED ORDER — FENTANYL CITRATE (PF) 100 MCG/2ML IJ SOLN
INTRAMUSCULAR | Status: DC | PRN
  Administered 2022-09-22: 50 ug via INTRAVENOUS

## 2022-09-22 MED ORDER — AMISULPRIDE (ANTIEMETIC) 5 MG/2ML IV SOLN: 10.0000 mg | Freq: Once | INTRAVENOUS | Status: DC | PRN

## 2022-09-22 MED ORDER — GABAPENTIN 300 MG PO CAPS
300.0000 mg | ORAL_CAPSULE | ORAL | Status: AC
  Administered 2022-09-22: 300 mg via ORAL

## 2022-09-22 MED ORDER — MIDAZOLAM HCL 5 MG/5ML IJ SOLN
INTRAMUSCULAR | Status: DC | PRN
  Administered 2022-09-22: 2 mg via INTRAVENOUS

## 2022-09-22 MED ORDER — FENTANYL CITRATE (PF) 100 MCG/2ML IJ SOLN
INTRAMUSCULAR | Status: AC
  Filled 2022-09-22: qty 2

## 2022-09-22 SURGICAL SUPPLY — 34 items
ADH SKN CLS APL DERMABOND .7 (GAUZE/BANDAGES/DRESSINGS) ×1
APL PRP STRL LF DISP 70% ISPRP (MISCELLANEOUS) ×1
BLADE SURG 15 STRL LF DISP TIS (BLADE) ×1 IMPLANT
BLADE SURG 15 STRL SS (BLADE) ×1
CANISTER SUC SOCK COL 7IN (MISCELLANEOUS) ×1 IMPLANT
CANISTER SUCT 1200ML W/VALVE (MISCELLANEOUS) ×1 IMPLANT
CHLORAPREP W/TINT 26 (MISCELLANEOUS) ×1 IMPLANT
COVER BACK TABLE 60X90IN (DRAPES) ×1 IMPLANT
COVER MAYO STAND STRL (DRAPES) ×1 IMPLANT
COVER PROBE W GEL 5X96 (DRAPES) ×1 IMPLANT
DERMABOND ADVANCED .7 DNX12 (GAUZE/BANDAGES/DRESSINGS) ×1 IMPLANT
DRAPE LAPAROSCOPIC ABDOMINAL (DRAPES) ×1 IMPLANT
DRAPE UTILITY XL STRL (DRAPES) ×1 IMPLANT
ELECT COATED BLADE 2.86 ST (ELECTRODE) ×1 IMPLANT
ELECT REM PT RETURN 9FT ADLT (ELECTROSURGICAL) ×1
ELECTRODE REM PT RTRN 9FT ADLT (ELECTROSURGICAL) ×1 IMPLANT
GLOVE BIO SURGEON STRL SZ7.5 (GLOVE) ×2 IMPLANT
GOWN STRL REUS W/ TWL LRG LVL3 (GOWN DISPOSABLE) ×2 IMPLANT
GOWN STRL REUS W/TWL LRG LVL3 (GOWN DISPOSABLE) ×2
KIT MARKER MARGIN INK (KITS) ×1 IMPLANT
NDL HYPO 25X1 1.5 SAFETY (NEEDLE) IMPLANT
NEEDLE HYPO 25X1 1.5 SAFETY (NEEDLE) ×1 IMPLANT
NS IRRIG 500ML POUR BTL (IV SOLUTION) IMPLANT
PACK BASIN DAY SURGERY FS (CUSTOM PROCEDURE TRAY) ×1 IMPLANT
PENCIL SMOKE EVACUATOR (MISCELLANEOUS) ×1 IMPLANT
SLEEVE SCD COMPRESS KNEE MED (STOCKING) ×1 IMPLANT
SPONGE T-LAP 18X18 ~~LOC~~+RFID (SPONGE) ×1 IMPLANT
SUT MON AB 4-0 PC3 18 (SUTURE) ×1 IMPLANT
SUT VICRYL 3-0 CR8 SH (SUTURE) ×1 IMPLANT
SYR CONTROL 10ML LL (SYRINGE) IMPLANT
TOWEL GREEN STERILE FF (TOWEL DISPOSABLE) ×1 IMPLANT
TRAY FAXITRON CT DISP (TRAY / TRAY PROCEDURE) ×1 IMPLANT
TUBE CONNECTING 20X1/4 (TUBING) ×1 IMPLANT
YANKAUER SUCT BULB TIP NO VENT (SUCTIONS) IMPLANT

## 2022-09-22 NOTE — Op Note (Signed)
09/22/2022  10:58 AM  PATIENT:  Deborah Lynch  51 y.o. female  PRE-OPERATIVE DIAGNOSIS:  RIGHT BREAST CSL  POST-OPERATIVE DIAGNOSIS:  RIGHT BREAST CSL  PROCEDURE:  Procedure(s): RIGHT BREAST LUMPECTOMY WITH RADIOACTIVE SEED LOCALIZATION (Right)  SURGEON:  Surgeon(s) and Role:    * Jovita Kussmaul, MD - Primary  PHYSICIAN ASSISTANT:   ASSISTANTS: none   ANESTHESIA:   local and general  EBL:  5 mL   BLOOD ADMINISTERED:none  DRAINS: none   LOCAL MEDICATIONS USED:  MARCAINE     SPECIMEN:  Source of Specimen:  right breast tissue  DISPOSITION OF SPECIMEN:  PATHOLOGY  COUNTS:  YES  TOURNIQUET:  * No tourniquets in log *  DICTATION: .Dragon Dictation  After informed consent was obtained the patient was brought to the operating room and placed in the supine position on the operating table.  After adequate induction of general anesthesia the patient's right breast was prepped with ChloraPrep, allowed to dry, and draped in usual sterile manner.  An appropriate timeout was performed.  Previously an I-125 seed was placed in the upper portion of the right breast to mark an area of complex sclerosing lesion.  The neoprobe was set to I-125 in the area of radioactivity was readily identified.  The area around this was infiltrated with quarter percent Marcaine.  A curvilinear incision was made along the upper edge of the areola with a 15 blade knife.  The incision was carried through the skin and subcutaneous tissue sharply with the electrocautery.  Dissection was carried out in the upper portion of the right breast between the breast tissue and the subcutaneous fat and skin.  Once this dissection was well beyond the area of the cancer I then removed the circular portion of breast tissue sharply with the electrocautery around the radioactive seed while checking the area of radioactivity frequently.  Once the specimen was removed it was oriented with the appropriate paint colors.  A specimen  radiograph was obtained that showed the clip and seed to be near the center of the specimen.  The specimen was then sent to pathology for further evaluation.  Hemostasis was achieved using the Bovie electrocautery.  The wound was irrigated with saline and infiltrated with more quarter percent Marcaine.  The deep layer of the incision was then closed with layers of interrupted 3-0 Vicryl stitches.  The skin was then closed with interrupted 4-0 Monocryl subcuticular stitches.  Dermabond dressings were applied.  The patient tolerated the procedure well.  At the end of the case all needle sponge and instrument counts were correct.  The patient was then awakened and taken to recovery in stable condition.  PLAN OF CARE: Discharge to home after PACU  PATIENT DISPOSITION:  PACU - hemodynamically stable.   Delay start of Pharmacological VTE agent (>24hrs) due to surgical blood loss or risk of bleeding: not applicable

## 2022-09-22 NOTE — H&P (Signed)
REFERRING PHYSICIAN: Luz Lex, MD  PROVIDER: Landry Corporal, MD  MRN: [id number] DOB: 1971/01/23 Subjective   Chief Complaint: New Consultation (Right Breast )   History of Present Illness: Deborah Lynch is a 51 y.o. female who is seen today as an office consultation for evaluation of New Consultation (Right Breast ) .   We are asked to see the patient in consultation by Dr. Louretta Shorten to evaluate her for a complex sclerosing lesion of the right breast. The patient is a 51 year old female who recently went for a routine screening mammogram. At that time she was found to have a 1.1 cm mass in the upper outer quadrant of the right breast. This was biopsied and came back as a complex sclerosing lesion. 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 She does have a family history of breast cancer in her mother and grandmother. She is otherwise in good health and does not smoke. She has had genetic testing in the past which she states was negative. She is also met with the high risk doctors at the cancer center to talk about risk reduction in the past.  Review of Systems: A complete review of systems was obtained from the patient. I have reviewed this information and discussed as appropriate with the patient. See HPI as well for other ROS.  ROS   Medical History: History reviewed. No pertinent past medical history.  Patient Active Problem List  Diagnosis  Complex sclerosing lesion of right breast   Past Surgical History:  Procedure Laterality Date  BREAST EXCISIONAL BIOPSY  2014, 2019, 2021, 2023  CESAREAN SECTION  x 2 2006, 2008    No Known Allergies  Current Outpatient Medications on File Prior to Visit  Medication Sig Dispense Refill  cholecalciferol 1000 unit tablet Take by mouth  multivitamin tablet Take 1 tablet by mouth once daily   No current facility-administered medications on file prior to visit.   History  reviewed. No pertinent family history.   Social History   Tobacco Use  Smoking Status Never  Smokeless Tobacco Never    Social History   Socioeconomic History  Marital status: Single  Tobacco Use  Smoking status: Never  Smokeless tobacco: Never  Substance and Sexual Activity  Alcohol use: Not Currently  Drug use: Never   Objective:  There were no vitals filed for this visit.  There is no height or weight on file to calculate BMI.  Physical Exam Vitals reviewed.  Constitutional:  General: She is not in acute distress. Appearance: Normal appearance.  HENT:  Head: Normocephalic and atraumatic.  Right Ear: External ear normal.  Left Ear: External ear normal.  Nose: Nose normal.  Mouth/Throat:  Mouth: Mucous membranes are moist.  Pharynx: Oropharynx is clear.  Eyes:  General: No scleral icterus. Extraocular Movements: Extraocular movements intact.  Conjunctiva/sclera: Conjunctivae normal.  Pupils: Pupils are equal, round, and reactive to light.  Cardiovascular:  Rate and Rhythm: Normal rate and regular rhythm.  Pulses: Normal pulses.  Heart sounds: Normal heart sounds.  Pulmonary:  Effort: Pulmonary effort is normal. No respiratory distress.  Breath sounds: Normal breath sounds.  Abdominal:  General: Bowel sounds are normal.  Palpations: Abdomen is soft.  Tenderness: There is no abdominal tenderness.  Musculoskeletal:  General: No swelling, tenderness or deformity. Normal range of motion.  Cervical back: Normal range of motion and neck supple.  Skin: General: Skin is warm and dry.  Coloration: Skin is not  jaundiced.  Neurological:  General: No focal deficit present.  Mental Status: She is alert and oriented to person, place, and time.  Psychiatric:  Mood and Affect: Mood normal.  Behavior: Behavior normal.    Breast: There is no palpable mass in either breast. There is no palpable axillary, supraclavicular, or cervical lymphadenopathy. She does have  symmetrically dense breast tissue bilaterally  Labs, Imaging and Diagnostic Testing:  Assessment and Plan:   Diagnoses and all orders for this visit:  Complex sclerosing lesion of right breast - [facility] Case Posting Request; Future    The patient appears to have a 1.1 cm complex sclerosing lesion in the upper outer quadrant of the right breast. Because of its abnormal appearance and because there is a 5 to 10% chance of missing something more significant and with her family history my recommendation would be to have this area removed. She would also like to have this done. I have discussed with her in detail the risks and benefits of the operation as well as some of the technical aspects including use of a radioactive seed for localization and she understands and wishes to proceed.

## 2022-09-22 NOTE — Anesthesia Preprocedure Evaluation (Signed)
Anesthesia Evaluation  Patient identified by MRN, date of birth, ID band Patient awake    Reviewed: Allergy & Precautions, NPO status , Patient's Chart, lab work & pertinent test results  Airway Mallampati: II  TM Distance: >3 FB Neck ROM: Full    Dental no notable dental hx. (+) Dental Advisory Given, Teeth Intact   Pulmonary neg pulmonary ROS,    Pulmonary exam normal breath sounds clear to auscultation       Cardiovascular negative cardio ROS Normal cardiovascular exam Rhythm:Regular Rate:Normal     Neuro/Psych negative neurological ROS     GI/Hepatic negative GI ROS, Neg liver ROS,   Endo/Other  negative endocrine ROS  Renal/GU negative Renal ROS     Musculoskeletal negative musculoskeletal ROS (+)   Abdominal   Peds  Hematology negative hematology ROS (+)   Anesthesia Other Findings   Reproductive/Obstetrics                             Anesthesia Physical Anesthesia Plan  ASA: 2  Anesthesia Plan: General   Post-op Pain Management: Tylenol PO (pre-op)*, Celebrex PO (pre-op)* and Gabapentin PO (pre-op)*   Induction: Intravenous  PONV Risk Score and Plan: 3 and Ondansetron, Dexamethasone, Treatment may vary due to age or medical condition and Midazolam  Airway Management Planned: LMA  Additional Equipment:   Intra-op Plan:   Post-operative Plan: Extubation in OR  Informed Consent: I have reviewed the patients History and Physical, chart, labs and discussed the procedure including the risks, benefits and alternatives for the proposed anesthesia with the patient or authorized representative who has indicated his/her understanding and acceptance.     Dental advisory given  Plan Discussed with: CRNA  Anesthesia Plan Comments:         Anesthesia Quick Evaluation

## 2022-09-22 NOTE — Discharge Instructions (Addendum)
  Post Anesthesia Home Care Instructions  Activity: Get plenty of rest for the remainder of the day. A responsible individual must stay with you for 24 hours following the procedure.  For the next 24 hours, DO NOT: -Drive a car -Paediatric nurse -Drink alcoholic beverages -Take any medication unless instructed by your physician -Make any legal decisions or sign important papers.  Meals: Start with liquid foods such as gelatin or soup. Progress to regular foods as tolerated. Avoid greasy, spicy, heavy foods. If nausea and/or vomiting occur, drink only clear liquids until the nausea and/or vomiting subsides. Call your physician if vomiting continues.  Special Instructions/Symptoms: Your throat may feel dry or sore from the anesthesia or the breathing tube placed in your throat during surgery. If this causes discomfort, gargle with warm salt water. The discomfort should disappear within 24 hours.  If you had a scopolamine patch placed behind your ear for the management of post- operative nausea and/or vomiting:  1. The medication in the patch is effective for 72 hours, after which it should be removed.  Wrap patch in a tissue and discard in the trash. Wash hands thoroughly with soap and water. 2. You may remove the patch earlier than 72 hours if you experience unpleasant side effects which may include dry mouth, dizziness or visual disturbances. 3. Avoid touching the patch. Wash your hands with soap and water after contact with the patch.     Can have Tylenol at 3:30 pm.

## 2022-09-22 NOTE — Anesthesia Procedure Notes (Signed)
Procedure Name: LMA Insertion Date/Time: 09/22/2022 10:17 AM  Performed by: Willa Frater, CRNAPre-anesthesia Checklist: Patient identified, Emergency Drugs available, Suction available and Patient being monitored Patient Re-evaluated:Patient Re-evaluated prior to induction Oxygen Delivery Method: Circle System Utilized Preoxygenation: Pre-oxygenation with 100% oxygen Induction Type: IV induction Ventilation: Mask ventilation without difficulty LMA: LMA inserted LMA Size: 3.0 Number of attempts: 1 Airway Equipment and Method: bite block Placement Confirmation: positive ETCO2 Tube secured with: Tape Dental Injury: Teeth and Oropharynx as per pre-operative assessment

## 2022-09-22 NOTE — Anesthesia Postprocedure Evaluation (Signed)
Anesthesia Post Note  Patient: Higher education careers adviser  Procedure(s) Performed: RIGHT BREAST LUMPECTOMY WITH RADIOACTIVE SEED LOCALIZATION (Right: Breast)     Patient location during evaluation: PACU Anesthesia Type: General Level of consciousness: sedated and patient cooperative Pain management: pain level controlled Vital Signs Assessment: post-procedure vital signs reviewed and stable Respiratory status: spontaneous breathing Cardiovascular status: stable Anesthetic complications: no   No notable events documented.  Last Vitals:  Vitals:   09/22/22 1135 09/22/22 1150  BP: (!) 122/57 136/73  Pulse: (!) 102 92  Resp: 15 14  Temp:  36.7 C  SpO2: 99% 97%    Last Pain:  Vitals:   09/22/22 1150  TempSrc: Oral  PainSc: 0-No pain                 Nolon Nations

## 2022-09-22 NOTE — Transfer of Care (Signed)
Immediate Anesthesia Transfer of Care Note  Patient: Therasa Bergeman  Procedure(s) Performed: RIGHT BREAST LUMPECTOMY WITH RADIOACTIVE SEED LOCALIZATION (Right: Breast)  Patient Location: PACU  Anesthesia Type:General  Level of Consciousness: sedated  Airway & Oxygen Therapy: Patient Spontanous Breathing and Patient connected to face mask oxygen  Post-op Assessment: Report given to RN and Post -op Vital signs reviewed and stable  Post vital signs: Reviewed and stable  Last Vitals:  Vitals Value Taken Time  BP    Temp    Pulse 99 09/22/22 1104  Resp    SpO2 99 % 09/22/22 1104  Vitals shown include unvalidated device data.  Last Pain:  Vitals:   09/22/22 0929  TempSrc: Temporal  PainSc: 0-No pain      Patients Stated Pain Goal: 3 (41/03/01 3143)  Complications: No notable events documented.

## 2022-09-22 NOTE — Interval H&P Note (Signed)
History and Physical Interval Note:  09/22/2022 9:55 AM  Deborah Lynch  has presented today for surgery, with the diagnosis of RIGHT BREAST CSL.  The various methods of treatment have been discussed with the patient and family. After consideration of risks, benefits and other options for treatment, the patient has consented to  Procedure(s): RIGHT BREAST LUMPECTOMY WITH RADIOACTIVE SEED LOCALIZATION (Right) as a surgical intervention.  The patient's history has been reviewed, patient examined, no change in status, stable for surgery.  I have reviewed the patient's chart and labs.  Questions were answered to the patient's satisfaction.     Autumn Messing III

## 2022-09-25 LAB — SURGICAL PATHOLOGY

## 2022-10-02 ENCOUNTER — Encounter (HOSPITAL_BASED_OUTPATIENT_CLINIC_OR_DEPARTMENT_OTHER): Payer: Self-pay | Admitting: General Surgery

## 2022-12-21 ENCOUNTER — Encounter (HOSPITAL_COMMUNITY): Payer: Self-pay

## 2023-08-21 ENCOUNTER — Other Ambulatory Visit: Payer: Self-pay | Admitting: Internal Medicine

## 2023-08-21 DIAGNOSIS — K769 Liver disease, unspecified: Secondary | ICD-10-CM

## 2023-09-27 ENCOUNTER — Ambulatory Visit
Admission: RE | Admit: 2023-09-27 | Discharge: 2023-09-27 | Disposition: A | Discharge status: Home / Self Care | Payer: 59 | Source: Ambulatory Visit | Attending: Internal Medicine | Admitting: Internal Medicine

## 2023-09-27 DIAGNOSIS — K769 Liver disease, unspecified: Secondary | ICD-10-CM

## 2023-09-27 MED ORDER — GADOPICLENOL 0.5 MMOL/ML IV SOLN
5.0000 mL | Freq: Once | INTRAVENOUS | Status: AC | PRN
  Administered 2023-09-27: 5 mL via INTRAVENOUS
